# Patient Record
Sex: Female | Born: 1950 | Race: White | Hispanic: No | Smoking: Current every day smoker
Health system: Southern US, Community
[De-identification: ages and names within clinical notes are randomized; demographics above are authoritative.]

## PROBLEM LIST (undated history)

## (undated) DIAGNOSIS — I739 Peripheral vascular disease, unspecified: Secondary | ICD-10-CM

## (undated) DIAGNOSIS — J449 Chronic obstructive pulmonary disease, unspecified: Secondary | ICD-10-CM

## (undated) DIAGNOSIS — M797 Fibromyalgia: Secondary | ICD-10-CM

## (undated) DIAGNOSIS — G709 Myoneural disorder, unspecified: Secondary | ICD-10-CM

## (undated) DIAGNOSIS — E785 Hyperlipidemia, unspecified: Secondary | ICD-10-CM

## (undated) DIAGNOSIS — I10 Essential (primary) hypertension: Secondary | ICD-10-CM

## (undated) DIAGNOSIS — I6350 Cerebral infarction due to unspecified occlusion or stenosis of unspecified cerebral artery: Secondary | ICD-10-CM

## (undated) DIAGNOSIS — E039 Hypothyroidism, unspecified: Secondary | ICD-10-CM

## (undated) DIAGNOSIS — R1312 Dysphagia, oropharyngeal phase: Secondary | ICD-10-CM

## (undated) DIAGNOSIS — IMO0001 Reserved for inherently not codable concepts without codable children: Secondary | ICD-10-CM

## (undated) DIAGNOSIS — G458 Other transient cerebral ischemic attacks and related syndromes: Secondary | ICD-10-CM

## (undated) DIAGNOSIS — R413 Other amnesia: Secondary | ICD-10-CM

## (undated) DIAGNOSIS — G8190 Hemiplegia, unspecified affecting unspecified side: Secondary | ICD-10-CM

## (undated) DIAGNOSIS — I2510 Atherosclerotic heart disease of native coronary artery without angina pectoris: Secondary | ICD-10-CM

## (undated) DIAGNOSIS — Z9981 Dependence on supplemental oxygen: Secondary | ICD-10-CM

## (undated) HISTORY — DX: Dependence on supplemental oxygen: Z99.81

## (undated) HISTORY — PX: THYMECTOMY: SHX1063

## (undated) HISTORY — DX: Chronic obstructive pulmonary disease, unspecified: J44.9

## (undated) HISTORY — DX: Hemiplegia, unspecified affecting unspecified side: G81.90

## (undated) HISTORY — DX: Peripheral vascular disease, unspecified: I73.9

## (undated) HISTORY — DX: Dysphagia, oropharyngeal phase: R13.12

## (undated) HISTORY — DX: Other amnesia: R41.3

## (undated) HISTORY — PX: ESOPHAGOGASTRODUODENOSCOPY: SHX1529

## (undated) HISTORY — PX: CORONARY STENT PLACEMENT: SHX1402

## (undated) HISTORY — PX: THYROID SURGERY: SHX805

## (undated) HISTORY — DX: Essential (primary) hypertension: I10

## (undated) HISTORY — PX: MOUTH SURGERY: SHX715

## (undated) HISTORY — DX: Myoneural disorder, unspecified: G70.9

## (undated) HISTORY — DX: Hyperlipidemia, unspecified: E78.5

## (undated) HISTORY — DX: Cerebral infarction due to unspecified occlusion or stenosis of unspecified cerebral artery: I63.50

## (undated) HISTORY — DX: Other transient cerebral ischemic attacks and related syndromes: G45.8

## (undated) HISTORY — DX: Hypothyroidism, unspecified: E03.9

## (undated) HISTORY — DX: Fibromyalgia: M79.7

## (undated) HISTORY — PX: COLONOSCOPY: SHX174

## (undated) HISTORY — PX: CATARACT EXTRACTION: SUR2

## (undated) HISTORY — DX: Atherosclerotic heart disease of native coronary artery without angina pectoris: I25.10

## (undated) HISTORY — DX: Reserved for inherently not codable concepts without codable children: IMO0001

---

## 2014-10-03 ENCOUNTER — Encounter: Payer: Self-pay | Admitting: Pulmonary Disease

## 2014-10-06 ENCOUNTER — Ambulatory Visit (INDEPENDENT_AMBULATORY_CARE_PROVIDER_SITE_OTHER): Payer: Self-pay | Admitting: Pulmonary Disease

## 2014-10-06 ENCOUNTER — Other Ambulatory Visit: Payer: Self-pay

## 2014-10-06 ENCOUNTER — Encounter: Payer: Self-pay | Admitting: Pulmonary Disease

## 2014-10-06 ENCOUNTER — Ambulatory Visit (INDEPENDENT_AMBULATORY_CARE_PROVIDER_SITE_OTHER)
Admission: RE | Admit: 2014-10-06 | Discharge: 2014-10-06 | Disposition: A | Discharge status: Home / Self Care | Payer: Self-pay | Source: Ambulatory Visit | Attending: Pulmonary Disease | Admitting: Pulmonary Disease

## 2014-10-06 VITALS — BP 140/64 | HR 76 | Ht 64.0 in | Wt 145.8 lb

## 2014-10-06 DIAGNOSIS — R059 Cough, unspecified: Secondary | ICD-10-CM

## 2014-10-06 DIAGNOSIS — J449 Chronic obstructive pulmonary disease, unspecified: Secondary | ICD-10-CM | POA: Insufficient documentation

## 2014-10-06 DIAGNOSIS — I10 Essential (primary) hypertension: Secondary | ICD-10-CM | POA: Insufficient documentation

## 2014-10-06 DIAGNOSIS — Z720 Tobacco use: Secondary | ICD-10-CM

## 2014-10-06 DIAGNOSIS — I2510 Atherosclerotic heart disease of native coronary artery without angina pectoris: Secondary | ICD-10-CM | POA: Insufficient documentation

## 2014-10-06 DIAGNOSIS — I739 Peripheral vascular disease, unspecified: Secondary | ICD-10-CM | POA: Insufficient documentation

## 2014-10-06 DIAGNOSIS — I639 Cerebral infarction, unspecified: Secondary | ICD-10-CM | POA: Insufficient documentation

## 2014-10-06 DIAGNOSIS — D510 Vitamin B12 deficiency anemia due to intrinsic factor deficiency: Secondary | ICD-10-CM | POA: Insufficient documentation

## 2014-10-06 DIAGNOSIS — R05 Cough: Secondary | ICD-10-CM

## 2014-10-06 DIAGNOSIS — J9691 Respiratory failure, unspecified with hypoxia: Secondary | ICD-10-CM | POA: Insufficient documentation

## 2014-10-06 DIAGNOSIS — G7000 Myasthenia gravis without (acute) exacerbation: Secondary | ICD-10-CM | POA: Insufficient documentation

## 2014-10-06 DIAGNOSIS — G8194 Hemiplegia, unspecified affecting left nondominant side: Secondary | ICD-10-CM | POA: Insufficient documentation

## 2014-10-06 DIAGNOSIS — M797 Fibromyalgia: Secondary | ICD-10-CM | POA: Insufficient documentation

## 2014-10-06 DIAGNOSIS — R55 Syncope and collapse: Secondary | ICD-10-CM | POA: Insufficient documentation

## 2014-10-06 DIAGNOSIS — F17200 Nicotine dependence, unspecified, uncomplicated: Secondary | ICD-10-CM

## 2014-10-06 DIAGNOSIS — E785 Hyperlipidemia, unspecified: Secondary | ICD-10-CM | POA: Insufficient documentation

## 2014-10-06 DIAGNOSIS — J9611 Chronic respiratory failure with hypoxia: Secondary | ICD-10-CM

## 2014-10-06 DIAGNOSIS — E063 Autoimmune thyroiditis: Secondary | ICD-10-CM | POA: Insufficient documentation

## 2014-10-06 DIAGNOSIS — G458 Other transient cerebral ischemic attacks and related syndromes: Secondary | ICD-10-CM | POA: Insufficient documentation

## 2014-10-06 NOTE — Patient Instructions (Signed)
1. We're going to check breathing tests as well as her oxygen while walking before your next appointment. 2. I'm checking a chest x-ray today to determine what could be causing her cough other than COPD. 3. Continue using her albuterol inhaler and nebulizer as prescribed. Your daughter is checking into drug company prescription assistance programs to help you afford inhalers. 4. We are checking a culture of her sputum to determine if you have any organisms that could be causing your cough. 5. Please continue working on gradually weaning herself off of cigarettes. 6. You'll return to clinic in 4-6 weeks but please contact my office if you have any further questions.

## 2014-10-06 NOTE — Progress Notes (Signed)
Subjective:    Patient ID: Erika Gross, female    DOB: Mar 13, 1950, 64 y.o.   MRN: [id number]  HPI She has a long history of tobacco use and was remotely diagnosed with COPD. She reports initially she had breathing tests about 30 years ago but none in the last decade. She has been on oxygen since 2010-2011. She reports she was previously on Advair and performing proper oral hygiene but developed "sores" in her mouth that caused her significant pain. Previously was tried on [facility] and tolerated it well but was unable to afford it. She reports her breathing was better on [facility] and it did seem to help her expectorate mucus. She routinely gets sick, especially in the winter months. She reports she gets sick 3-4 times a season requiring antibiotics and/or steroids. She reports a chronic cough productive of a tan to greenish tinged mucus that amounts to 100-200cc daily. She denies any hemoptysis. She does frequently have wheezing. She does have intermittent chest discomfort that feels like a "gnawing" pain. She has had stents placed before and follows with cardiology. She does have frequent falls. She reports she runs an intermittent low-grade fever on a daily basis. She reports hot & cold chills with sweats intermittently. No dysuria or hematuria. She uses her nebulizer/rescue inhaler twice daily and feels it does help with her breathing.  Review of Systems No nausea or emesis. She reports diffuse lower abdominal pain that has been ongoing for a couple weeks. No rashes but does bruise easily. A pertinent 14 point review of systems is negative except as per the history of presenting illness.  Allergies  Allergen Reactions  . Advair Diskus [Fluticasone-Salmeterol]   . Ibuprofen     nausea  . Nsaids     D/t bleeding in stomach   Current Outpatient Prescriptions on File Prior to Visit  Medication Sig Dispense Refill  . albuterol (PROVENTIL HFA;VENTOLIN HFA) 108 (90 BASE) MCG/ACT inhaler Inhale  1-2 puffs into the lungs 2 (two) times daily.    Marland Kitchen amitriptyline (ELAVIL) 50 MG tablet Take 50 mg by mouth at bedtime.    Marland Kitchen aspirin 81 MG tablet Take 81 mg by mouth daily.    . baclofen (LIORESAL) 10 MG tablet Take 10 mg by mouth 3 (three) times daily.    . clopidogrel (PLAVIX) 75 MG tablet Take 75 mg by mouth daily.    Marland Kitchen gabapentin (NEURONTIN) 300 MG capsule Take 300 mg by mouth 4 (four) times daily.    Marland Kitchen HYDROcodone-acetaminophen (NORCO) 7.5-325 MG per tablet Take 1 tablet by mouth every 6 (six) hours as needed for moderate pain.    . isosorbide dinitrate (ISORDIL) 30 MG tablet Take 30 mg by mouth daily.    Marland Kitchen levothyroxine (SYNTHROID, LEVOTHROID) 112 MCG tablet Take 112 mcg by mouth daily before breakfast.    . levothyroxine (SYNTHROID, LEVOTHROID) 300 MCG tablet Take 300 mcg by mouth daily before breakfast.    . LORazepam (ATIVAN) 1 MG tablet Take 1 mg by mouth every 8 (eight) hours.    . methocarbamol (ROBAXIN) 750 MG tablet 1-2 tabs 3 times a day as needed    . metoprolol tartrate (LOPRESSOR) 25 MG tablet Take 25 mg by mouth daily.    . mometasone-formoterol (DULERA) 100-5 MCG/ACT AERO Inhale 2 puffs into the lungs 2 (two) times daily.    . pantoprazole (PROTONIX) 40 MG tablet Take 40 mg by mouth 2 (two) times daily.    . Polyethylene Glycol 1000 POWD by Does not  apply route at bedtime.    . pravastatin (PRAVACHOL) 40 MG tablet Take 80 mg by mouth daily.    . traZODone (DESYREL) 100 MG tablet Take 100 mg by mouth at bedtime.     No current facility-administered medications on file prior to visit.   Past Medical History  Diagnosis Date  . Memory loss   . Myalgia and myositis   . Myasthenic syndrome   . Oxygen dependent   . Subclavian steal syndrome   . Hemiplegia affecting nondominant side   . Dysphagia, oropharyngeal phase   . Cerebral artery occlusion with cerebral infarction   . Fibromyalgia   . Coronary artery disease     s/p stent  . Peripheral vascular disease   . COPD  (chronic obstructive pulmonary disease)   . Hypertension   . Hypothyroidism     Hashimoto's thyroiditis  . Hyperlipidemia    Past Surgical History  Procedure Laterality Date  . Coronary stent placement    . Thyroid surgery    . Cesarean section    . Cataract extraction      left eye  . Thymectomy    . Mouth surgery      skin graft  . Colonoscopy    . Esophagogastroduodenoscopy     Family History  Problem Relation Age of Onset  . Ovarian cancer Mother   . Heart attack Mother   . Lung cancer Mother   . Tuberculosis Father   . Anuerysm Father   . Lung cancer Father   . Thyroid disease Paternal Aunt   . Crohn's disease Daughter   . Uveitis Daughter   . Fibromyalgia Daughter   . Asthma Daughter   . Renal Disease Son   . Celiac disease Grandchild   . Asthma Grandchild    History   Social History  . Marital Status: Widowed    Spouse Name: N/A  . Number of Children: 2  . Years of Education: N/A   Occupational History  . disabled    Social History Main Topics  . Smoking status: Current Every Day Smoker -- 1.00 packs/day for 48 years    Types: Cigarettes    Start date: 02/28/1966  . Smokeless tobacco: Not on file     Comment: peak rate of 1.5ppd  . Alcohol Use: No  . Drug Use: No  . Sexual Activity: Not on file   Other Topics Concern  . None   Social History Narrative   Originally from [place]. Always lived in [place]. Previously traveled to [place], [place], [place], & [place]. No international travel. 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 Never had treatment. She has a cat & dogs currently. No bird, mold, or hot tub exposure. 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       Objective:   Physical Exam  Blood pressure 140/64, pulse 76, height  (1.626 m), weight 145 lb 12.8 oz (66.134 kg), SpO2 97 %. General:  Awake. Alert. No acute distress. Daughter accompanying patient today.    Integument:  Warm & dry. No rash on exposed skin. Bruise of various ages on bilateral upper extremities. Lymphatics:  No appreciated cervical or supraclavicular lymphadenoapthy. HEENT:  Moist mucus membranes. No oral ulcers. No scleral injection or icterus. PERRL. Cardiovascular:  Regular rate. No edema. No appreciable JVD.  Pulmonary:  Bilateral basilar crackles. Good aeration bilaterally. Symmetric chest wall expansion. No  accessory muscle use on nasal cannula oxygen. Abdomen: Soft. Normal bowel sounds. Nondistended. Grossly nontender. Musculoskeletal:  Normal bulk and tone. Hand grip strength 4+/5 bilaterally. No joint deformity or effusion appreciated. Neurological:  CN 2-12 grossly in tact. No meningismus. Moving all 4 extremities equally. Symmetric patellar deep tendon reflexes. Psychiatric:  Mood and affect congruent. Speech normal rhythm, rate & tone.     Assessment & Plan:  The patient is a 64 year old female with remote history of diagnosis of COPD with chronic bronchitis as well as chronic hypoxic respiratory failure. Patient continues to use tobacco and I spent over 3 minutes counseling the patient on the need to completely quit and abstain from its use to prevent worsening of lung function as well as other potential consequences including stroke, heart attack, etc. We also discussed at length her ability to 4 medications and I recommended her daughter who works in a pharmacy investigating into prescription drug assistance/charity programs for [employer] or [employer]. Her cardiologist has recommended against Wellbutrin, Chantix, and nicotine replacement given her medical problems and history. I instructed the patient to contact my office if she had any further questions or concerns before her next appointment.  1. COPD: Unclear severity. Checking full PFT with bronchodilator challenge before next appointment. Also checking alpha-1 antitrypsin level & genotype. Patient to continue using when  necessary albuterol. Daughter investigating prescription drug assistance programs for [facility] & [employer]. 2. Cough: Checking sputum culture for AFB, fungus, & bacteria. 3. Chronic hypoxic respiratory failure: Checking 6 minute walk test. 4. Ongoing tobacco use: Spelled over 3 minutes counseling the patient will need to completely quit smoking. Not candidate for nicotine replacement, Chantix, or Wellbutrin. Recommend trying a gradual taper of cigarette use. 5. History of TB exposure/+PPD: Patient never treated. Checking chest x-ray PA/LAT today. 6. Follow-up: Patient to return to clinic in 4-6 weeks or sooner if needed.

## 2014-10-07 ENCOUNTER — Telehealth: Payer: Self-pay | Admitting: Pulmonary Disease

## 2014-10-07 ENCOUNTER — Other Ambulatory Visit: Payer: Self-pay | Admitting: Pulmonary Disease

## 2014-10-07 DIAGNOSIS — J849 Interstitial pulmonary disease, unspecified: Secondary | ICD-10-CM | POA: Insufficient documentation

## 2014-10-07 NOTE — Telephone Encounter (Signed)
Called and spoke with patient's daughter regarding CXR results highly suspicious/suggestive of ILD. Checking HRCT Chest W/O. Further workup depending on this result.

## 2014-10-08 LAB — ALPHA-1-ANTITRYPSIN: A1 ANTITRYPSIN SER: 125 mg/dL (ref 83–199)

## 2014-10-14 ENCOUNTER — Inpatient Hospital Stay: Admission: RE | Admit: 2014-10-14 | Payer: Self-pay | Source: Ambulatory Visit

## 2014-10-15 ENCOUNTER — Other Ambulatory Visit: Payer: Self-pay | Admitting: Pulmonary Disease

## 2014-10-15 ENCOUNTER — Telehealth: Payer: Self-pay | Admitting: Pulmonary Disease

## 2014-10-15 ENCOUNTER — Other Ambulatory Visit: Payer: Self-pay

## 2014-10-15 ENCOUNTER — Ambulatory Visit (INDEPENDENT_AMBULATORY_CARE_PROVIDER_SITE_OTHER)
Admission: RE | Admit: 2014-10-15 | Discharge: 2014-10-15 | Disposition: A | Discharge status: Home / Self Care | Payer: Self-pay | Source: Ambulatory Visit | Attending: Pulmonary Disease | Admitting: Pulmonary Disease

## 2014-10-15 DIAGNOSIS — J849 Interstitial pulmonary disease, unspecified: Secondary | ICD-10-CM

## 2014-10-15 DIAGNOSIS — R059 Cough, unspecified: Secondary | ICD-10-CM

## 2014-10-15 DIAGNOSIS — R05 Cough: Secondary | ICD-10-CM

## 2014-10-15 NOTE — Telephone Encounter (Signed)
Attempted to call regarding results of CT scan. No answer. Left messages I would contact again at a different time.

## 2014-10-15 NOTE — Telephone Encounter (Signed)
Spoke with the patient regarding the results of her high-resolution CT scan. Her daughter was also present for the conversation via phone. It seems as though her underlying emphysema is more pronounced than what appears to be a mild amount of subpleural intralobular septal thickening most pronounced in the bases. Questionable minimal bronchiectasis. This does favor in MS IP pattern but could also be early UIP. Plan to screen the patient for underlying hypersensitivity pneumonitis versus an autoimmune disease that could be contributing. Also relayed the results of her serum alpha-1 level as normal but still awaiting genotype.

## 2014-10-18 LAB — RESPIRATORY CULTURE OR RESPIRATORY AND SPUTUM CULTURE
Culture: NORMAL
Organism ID, Bacteria: NORMAL

## 2014-10-22 ENCOUNTER — Telehealth: Payer: Self-pay | Admitting: Pulmonary Disease

## 2014-10-22 NOTE — Telephone Encounter (Signed)
Still no answer. Left generic voicemail to contact clinic to set up appointment for labs.

## 2014-10-22 NOTE — Telephone Encounter (Signed)
Called spoke with daughter Marchelle Folks. She is aware lab orders were already placed. Nothing further needed

## 2014-10-27 ENCOUNTER — Other Ambulatory Visit (INDEPENDENT_AMBULATORY_CARE_PROVIDER_SITE_OTHER): Payer: Self-pay

## 2014-10-27 ENCOUNTER — Other Ambulatory Visit: Payer: Self-pay

## 2014-10-27 DIAGNOSIS — J849 Interstitial pulmonary disease, unspecified: Secondary | ICD-10-CM

## 2014-10-27 DIAGNOSIS — J9611 Chronic respiratory failure with hypoxia: Secondary | ICD-10-CM

## 2014-10-27 LAB — SEDIMENTATION RATE: SED RATE: 19 mm/h (ref 0–22)

## 2014-10-28 LAB — CYCLIC CITRUL PEPTIDE ANTIBODY, IGG: Cyclic Citrullin Peptide Ab: <2 U/mL (ref 0.0–5.0)

## 2014-10-30 LAB — RESPIRATORY CULTURE OR RESPIRATORY AND SPUTUM CULTURE: Organism ID, Bacteria: NORMAL

## 2014-11-02 LAB — HYPERSENSITIVITY PNUEMONITIS PROFILE

## 2014-11-10 LAB — FUNGUS CULTURE W SMEAR: Smear Result: NONE SEEN

## 2014-11-13 ENCOUNTER — Ambulatory Visit: Payer: Self-pay

## 2014-11-13 ENCOUNTER — Ambulatory Visit (INDEPENDENT_AMBULATORY_CARE_PROVIDER_SITE_OTHER): Payer: Self-pay | Admitting: Pulmonary Disease

## 2014-11-13 ENCOUNTER — Other Ambulatory Visit: Payer: Self-pay

## 2014-11-13 VITALS — BP 134/68 | HR 84 | Ht 64.0 in | Wt 142.0 lb

## 2014-11-13 DIAGNOSIS — R059 Cough, unspecified: Secondary | ICD-10-CM

## 2014-11-13 DIAGNOSIS — J849 Interstitial pulmonary disease, unspecified: Secondary | ICD-10-CM

## 2014-11-13 DIAGNOSIS — Z201 Contact with and (suspected) exposure to tuberculosis: Secondary | ICD-10-CM

## 2014-11-13 DIAGNOSIS — R0902 Hypoxemia: Secondary | ICD-10-CM

## 2014-11-13 DIAGNOSIS — J439 Emphysema, unspecified: Secondary | ICD-10-CM | POA: Insufficient documentation

## 2014-11-13 DIAGNOSIS — J449 Chronic obstructive pulmonary disease, unspecified: Secondary | ICD-10-CM

## 2014-11-13 DIAGNOSIS — R05 Cough: Secondary | ICD-10-CM

## 2014-11-13 LAB — PULMONARY FUNCTION TEST
DL/VA % PRED: 53 %
DL/VA: 2.56 ml/min/mmHg/L
DLCO UNC: 9.03 ml/min/mmHg
DLCO unc % pred: 37 %
FEF 25-75 POST: 1.33 L/s
FEF 25-75 Pre: 1.28 L/sec
FEF2575-%Change-Post: 3 %
FEF2575-%PRED-POST: 59 %
FEF2575-%PRED-PRE: 56 %
FEV1-%CHANGE-POST: -1 %
FEV1-%PRED-PRE: 64 %
FEV1-%Pred-Post: 63 %
FEV1-POST: 1.59 L
FEV1-PRE: 1.61 L
FEV1FVC-%Change-Post: 0 %
FEV1FVC-%PRED-PRE: 99 %
FEV6-%Change-Post: -1 %
FEV6-%PRED-POST: 65 %
FEV6-%Pred-Pre: 66 %
FEV6-Post: 2.03 L
FEV6-Pre: 2.06 L
FEV6FVC-%CHANGE-POST: 0 %
FEV6FVC-%Pred-Post: 103 %
FEV6FVC-%Pred-Pre: 103 %
FVC-%CHANGE-POST: -1 %
FVC-%PRED-POST: 63 %
FVC-%Pred-Pre: 64 %
FVC-PRE: 2.08 L
FVC-Post: 2.04 L
POST FEV1/FVC RATIO: 78 %
PRE FEV1/FVC RATIO: 77 %
PRE FEV6/FVC RATIO: 99 %
Post FEV6/FVC ratio: 99 %

## 2014-11-13 MED ORDER — FLUTTER DEVI: Status: AC

## 2014-11-13 NOTE — Progress Notes (Signed)
PFT done today. 

## 2014-11-13 NOTE — Patient Instructions (Signed)
1. Continue using her nebulizer and inhalers as prescribed. 2. We are giving you a prescription for a flutter valve/Acapella. Blow into this device 3 times every time you use it. And use it 3 times daily to help break up mucus and cough. 3. I'm doing blood work today to check every 4 different diseases that could be causing the inflammation/scarring in your lungs that we showed on her CT scan. 4. Continue to try to wean your cigarette use daily. 5. I will see her back in 2-3 months but please call me if you have any new breathing problems.

## 2014-11-13 NOTE — Progress Notes (Signed)
Subjective:    Patient ID: Erika Gross, female    DOB: 1950/06/01, 64 y.o.   MRN: [id number]  C.C.:  Follow-up for COPD w/ Emphysema, Cough, Hypoxia, Ongoing Tobacco Use, & History of TB Exposure/+PPD.  HPI  COPD w/ Emphysema:  Patient has no evidence of airway obstruction on spirometry today. Patient's daughter was checking for potential drug assistance programs for [facility] & [employer] at last appointment. Patient did have air trapping on high-resolution CT imaging suggesting airways dysfunction. She is using her nebulizer twice daily. She reports chronic wheezing.   Cough: Likely secondary to underlying parenchymal lung disease seen on CT imaging favoring NSIP. Autoimmune workup negative so far but repeat testing has not been performed as ordered and requested of the patient. She reports her dyspnea does seem to fluctuate. She continues to cough and reports she did have a "low-grade" temperature last week for 2 days. She is still producing a dark, greenish mucus.   Hypoxia:  Patient unable to perform walk test today due to instability of gait. She continues to use oxygen and is on 3 L/m continuously. She increases this to 4 L/m on her own at times with increased dyspnea.  Ongoing Tobacco use: Recommend trying a gradual taper of cigarette use at last appointment. Patient reports she has only been able to get down to 10 cigarettes daily at best since last appointment. She reports she is "really trying".  H/O TB Exposure/+ PPD: Patient never treated.  Review of Systems She reports constant chest pain in her anterior chest. She does have pleurisy at times as well. No new rashes or bruising.  Allergies  Allergen Reactions  . Advair Diskus [Fluticasone-Salmeterol]   . Ibuprofen     nausea  . Nsaids     D/t bleeding in stomach   Current Outpatient Prescriptions on File Prior to Visit  Medication Sig Dispense Refill  . albuterol (PROVENTIL HFA;VENTOLIN HFA) 108 (90 BASE) MCG/ACT  inhaler Inhale 1-2 puffs into the lungs 2 (two) times daily.    Marland Kitchen amitriptyline (ELAVIL) 50 MG tablet Take 50 mg by mouth at bedtime.    Marland Kitchen aspirin 81 MG tablet Take 81 mg by mouth daily.    . baclofen (LIORESAL) 10 MG tablet Take 10 mg by mouth 3 (three) times daily.    . clopidogrel (PLAVIX) 75 MG tablet Take 75 mg by mouth daily.    Marland Kitchen gabapentin (NEURONTIN) 300 MG capsule Take 300 mg by mouth 4 (four) times daily.    Marland Kitchen ibuprofen (ADVIL,MOTRIN) 200 MG tablet Take 200 mg by mouth every 6 (six) hours as needed.    Marland Kitchen ipratropium-albuterol (DUONEB) 0.5-2.5 (3) MG/3ML SOLN Take 3 mLs by nebulization 2 (two) times daily.     . isosorbide dinitrate (ISORDIL) 30 MG tablet Take 30 mg by mouth daily.    Marland Kitchen levothyroxine (SYNTHROID, LEVOTHROID) 150 MCG tablet Take 150 mcg by mouth daily before breakfast.    . LORazepam (ATIVAN) 1 MG tablet Take 1 mg by mouth every 8 (eight) hours.    . methocarbamol (ROBAXIN) 750 MG tablet 1-2 tabs 3 times a day as needed    . metoprolol tartrate (LOPRESSOR) 25 MG tablet Take 25 mg by mouth daily.    . pantoprazole (PROTONIX) 40 MG tablet Take 40 mg by mouth 2 (two) times daily.    . Polyethylene Glycol 1000 POWD by Does not apply route at bedtime.    . pravastatin (PRAVACHOL) 40 MG tablet Take 80 mg by mouth daily.    Marland Kitchen  traZODone (DESYREL) 100 MG tablet Take 100 mg by mouth at bedtime.     No current facility-administered medications on file prior to visit.   Past Medical History  Diagnosis Date  . Memory loss   . Myalgia and myositis   . Myasthenic syndrome   . Oxygen dependent   . Subclavian steal syndrome   . Hemiplegia affecting nondominant side   . Dysphagia, oropharyngeal phase   . Cerebral artery occlusion with cerebral infarction   . Fibromyalgia   . Coronary artery disease     s/p stent  . Peripheral vascular disease   . COPD (chronic obstructive pulmonary disease)   . Hypertension   . Hypothyroidism     Hashimoto's thyroiditis  . Hyperlipidemia     Past Surgical History  Procedure Laterality Date  . Coronary stent placement    . Thyroid surgery    . Cesarean section    . Cataract extraction      left eye  . Thymectomy    . Mouth surgery      skin graft  . Colonoscopy    . Esophagogastroduodenoscopy     Family History  Problem Relation Age of Onset  . Ovarian cancer Mother   . Heart attack Mother   . Lung cancer Mother   . Tuberculosis Father   . Anuerysm Father   . Lung cancer Father   . Thyroid disease Paternal Aunt   . Crohn's disease Daughter   . Uveitis Daughter   . Fibromyalgia Daughter   . Asthma Daughter   . Renal Disease Son   . Celiac disease Grandchild   . Asthma Grandchild    Social History   Social History  . Marital Status: Widowed    Spouse Name: N/A  . Number of Children: 2  . Years of Education: N/A   Occupational History  . disabled    Social History Main Topics  . Smoking status: Current Every Day Smoker -- 1.00 packs/day for 48 years    Types: Cigarettes    Start date: 02/28/1966  . Smokeless tobacco: Not on file     Comment: peak rate of 1.5ppd  . Alcohol Use: No  . Drug Use: No  . Sexual Activity: Not on file   Other Topics Concern  . Not on file   Social History Narrative   Originally from [place]. Always lived in [place]. Previously traveled to [place], [place], [place], & [place]. No international travel. 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 Never had treatment. She has a cat & dogs currently. No bird, mold, or hot tub exposure. 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       Objective:   Physical Exam  Blood pressure 134/68, pulse 84, height 5' 4" (1.626 m), weight 142 lb (64.411 kg), SpO2 97 %. General:  Awake. Alert. No acute distress. Daughter again accompanying patient today.  Integument:  Warm & dry. No rash on exposed skin.  Lymphatics:  No appreciated cervical or supraclavicular  lymphadenoapthy. HEENT:  Moist mucus membranes. No oral ulcers. No scleral injection. Cardiovascular:  Regular rate. No edema. No appreciable JVD.  Pulmonary:  Bilateral basilar crackles unchanged. Good aeration bilaterally. Musculoskeletal: Hand grip strength 4+/5 bilaterally. No joint deformity or effusion appreciated.   PFT 11/13/14: FVC 2.08 L (64%) FEV1 1.61 L (64%) FEV1/FVC 0.77 FEF 25-75 1.28 L (86%) no bronchodilator response TLC 3.52  L (69%) RV 73% ERV 65% DLCO uncorrected 37%  IMAGING HRCT CHEST W/O 10/15/14 (personally reviewed by me with the patient and her daughter today): No mediastinal mass. Subcarinal lymph node measures 1.1 cm in short axis. No pleural or pericardial effusion. No pleural thickening appreciated. Patchy groundglass bilaterally as well as some thickening of the peribronchovascular interstitium. Mild cylindrical bronchiectasis most predominantly in the lower lung zones. Diffuse bronchial wall thickening. Centrilobular & moderate paraseptal emphysema with areas of air trapping. 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  MICROBIOLOGY SPUTUM CTX (10/27/14): Normal oral flora SPUTUM CTX (10/15/14): Normal oral flora/Candida albicans/AFB ngtd  LABS 10/27/14 ESR: 19 Anti-CCP: <2.0 Hypersensitivity pneumonitis panel: Negative  10/06/14 Alpha-1 antitrypsin: 125    Assessment & Plan:  64 year old very complex patient. She does have a NSIP pattern on her high-resolution CT imaging. So far her workup has been negative. I'm still waiting on a couple of additional tests. I personally reviewed her high-resolution CT scan with her today with her daughter present. We discussed at length her need for complete tobacco cessation as I do feel this is contributing to not only her underlying emphysema but also her chronic cough. I reviewed the results of her sputum culture tests which have not shown any evidence of an infectious etiology. Her spirometry today shows no evidence of airway obstruction but  certainly the air trapping seen on her high-resolution CT imaging correlates with the small airways dysfunction found today. The mild restriction on her lung volumes and substantial reduction in her monoxide diffusion capacity correlate with her underlying ILD. I do not feel that initiating immunosuppression is necessary at this time given her ongoing tobacco use and multiple other medical problems. I am somewhat concerned about possible reactivation tuberculosis given her prior history of positive PPD. We attempted to perform a walk test today to evaluate her degree of hypoxia but were unsuccessful as the patient had difficulty walking by herself. I instructed the patient and her daughter to contact me if they have any further questions or concerns or new breathing problems before her next appointment.  1. ILD/NSIP/mild restrictive lung disease: Holding on initiating immunosuppression at this time. Checking serum ANA with reflex to compress to panel, rheumatoid factor, and CRP. Repeat spirometry with DLCO at next appointment. 2. COPD with emphysema: Continuing patient on Duonebs. Daughter is continuing to investigate prescription drug assistance with [facility]. Instructed to use flutter valve 3 times a day to help with airway clearance. Awaiting finalization of sputum cultures. 3. Hypoxia: Patient to continue using oxygen as previously prescribed. Consider repeat 6 minute walk test with use of a walker or her wheelchair as a walking aid. 4. TB exposure/prior positive PPD: Checking serum QuantiFERON TB. This will need to be confirmed as negative prior to any consideration of immunosuppression. 5. Ongoing tobacco use: Spelled over 3 minutes counseling the patient will need for complete tobacco cessation. Recommended decreasing her use by 1 cigarette every week in an effort to completely quit. 6. Health maintenance: Recommended influenza vaccination in October. 7. Follow-up: Patient to return to clinic in 2-3  months or sooner if needed.

## 2014-11-14 LAB — ANA COMPREHENSIVE PANEL
Anti JO-1: <0.2 AI (ref 0.0–0.9)
Centromere Ab Screen: 3.8 AI — ABNORMAL HIGH (ref 0.0–0.9)
ENA SSA (RO) AB: 1.4 AI — AB (ref 0.0–0.9)
dsDNA Ab: 1 IU/mL (ref 0–9)

## 2014-11-14 LAB — ANA W/REFLEX: Anti Nuclear Antibody(ANA): POSITIVE — AB

## 2014-11-14 LAB — RHEUMATOID FACTOR: RHEUMATOID FACTOR: 15.4 [IU]/mL — AB (ref 0.0–13.9)

## 2014-11-14 LAB — C-REACTIVE PROTEIN: CRP: 8.4 mg/L — AB (ref 0.0–4.9)

## 2014-11-15 LAB — QUANTIFERON TB GOLD ASSAY (BLOOD)
INTERFERON GAMMA RELEASE ASSAY: NEGATIVE
Mitogen value: >10 IU/mL
QUANTIFERON NIL VALUE: 0.05 [IU]/mL
Quantiferon Tb Ag Minus Nil Value: 0 IU/mL
TB AG VALUE: 0.05 [IU]/mL

## 2014-11-27 LAB — AFB CULTURE WITH SMEAR (NOT AT ARMC): ACID FAST SMEAR: NONE SEEN

## 2014-12-02 ENCOUNTER — Telehealth: Payer: Self-pay | Admitting: Pulmonary Disease

## 2014-12-02 DIAGNOSIS — J849 Interstitial pulmonary disease, unspecified: Secondary | ICD-10-CM

## 2014-12-02 NOTE — Telephone Encounter (Signed)
Dr. Jamison Neighbor did you try to contact this patient? I do not seen any documentation in her chart. Thanks.

## 2014-12-12 ENCOUNTER — Telehealth: Payer: Self-pay | Admitting: Pulmonary Disease

## 2014-12-12 NOTE — Telephone Encounter (Signed)
Already called the patient. Erika Gross

## 2014-12-12 NOTE — Telephone Encounter (Signed)
Spoke with the patient regarding the results of her serum autoimmune workup. Findings appear mixed. It's unclear to me whether or not her myasthenia gravis could be affecting the serum test results. I am referring the patient to Dr. Rowe/Rheumatology for further evaluation and input. Patient is cutting back on her cigarette use by one pack per week. I encouraged her to continue. She will contact my office for any further questions or concerns.

## 2014-12-12 NOTE — Addendum Note (Signed)
Addended by: Tommie SamsSILVA, MINDY S on: 12/12/2014 03:24 PM   Modules accepted: Orders

## 2014-12-12 NOTE — Telephone Encounter (Signed)
Referral done. Do I need to make pt aware or does she already know?

## 2014-12-12 NOTE — Telephone Encounter (Signed)
Please refer the patient to Dr. Phylliss Bobowe (Rheumatology) for her ILD & abnormal autoimmune workup. Thanks.

## 2014-12-19 ENCOUNTER — Other Ambulatory Visit: Payer: Self-pay | Admitting: Pulmonary Disease

## 2014-12-19 DIAGNOSIS — J849 Interstitial pulmonary disease, unspecified: Secondary | ICD-10-CM

## 2014-12-22 ENCOUNTER — Telehealth: Payer: Self-pay | Admitting: Pulmonary Disease

## 2014-12-22 NOTE — Telephone Encounter (Signed)
Per referral note on pt: Note    Rheumatology appt with Dr. Maryjean KaKushwaha @ [facility] on 03/12/2015 @ 2:30pm Called and spoke with the patient she ask if it was covered under the [facility] Assistance Program I explained that it was not and she stated that she couldn't afford [facility] but she took down the appt information just in case.Lilian KapurAnita S Walker    --  Called spoke with pt. Made her aware of previous phone note that Dr. Jamison NeighborNestor did speak with her.  She wants Dr. Jamison NeighborNestor to know she may not be able to afford to go to [facility] and may cancel appt. Please advise thanks

## 2014-12-24 NOTE — Telephone Encounter (Signed)
Dr. Nestor please advise. Thanks. 

## 2014-12-25 NOTE — Telephone Encounter (Signed)
Well if she cannot afford it there's not much that can be helped. I'll see her at her scheduled f/u in December. JN

## 2014-12-26 NOTE — Telephone Encounter (Signed)
lmtcb x1 for pt. 

## 2014-12-29 NOTE — Telephone Encounter (Signed)
Pt is aware of JN's response. Nothing further was needed.

## 2015-02-12 ENCOUNTER — Ambulatory Visit: Payer: Self-pay | Admitting: Pulmonary Disease

## 2017-02-26 IMAGING — CT CT CHEST HIGH RESOLUTION W/O CM
3 of 8 series · 13 of 36 positions shown, 15 images · non-contrast
Comparison: No priors.

CLINICAL DATA: 64-year-old female with end-stage COPD on chronic
oxygen therapy. Productive cough. Evaluate for interstitial lung
disease.

EXAM:
CT CHEST WITHOUT CONTRAST
TECHNIQUE: Multidetector CT imaging of the chest was performed following the
standard protocol without intravenous contrast. High resolution
imaging of the lungs, as well as inspiratory and expiratory imaging,
was performed.

[Series 6: lung · axial · 0.63mm/px · z∈[-258,-48]mm · 7 of 56 slices shown, 9 images]
[im 7/56  mediastinal]
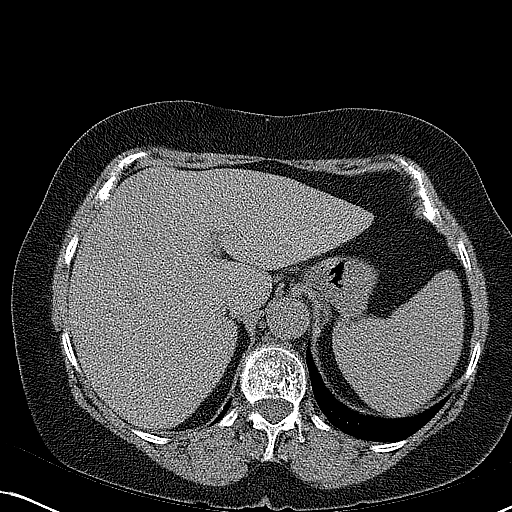
[im 7/56  lung]
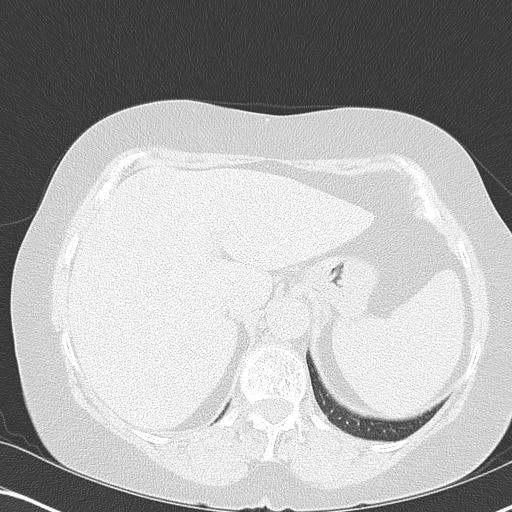
[im 14/56  lung]
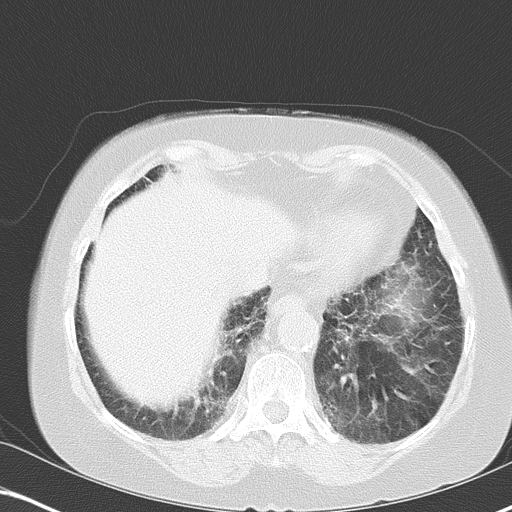
[im 21/56  lung]
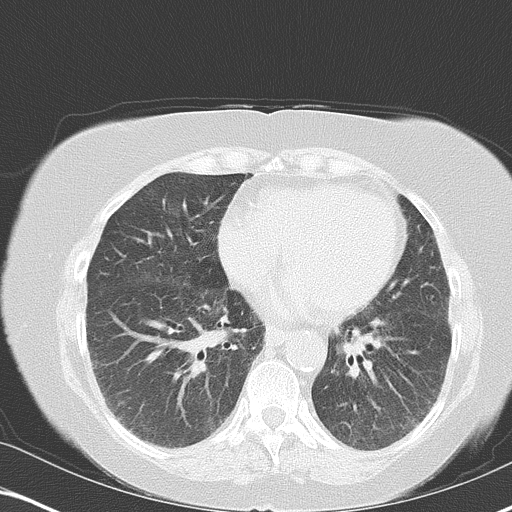
[im 28/56  lung]
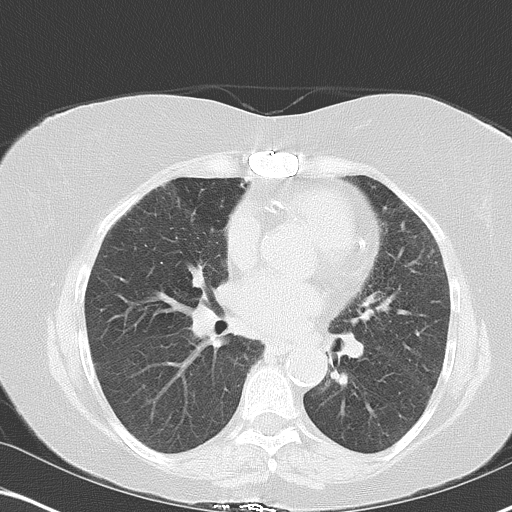
[im 35/56  mediastinal]
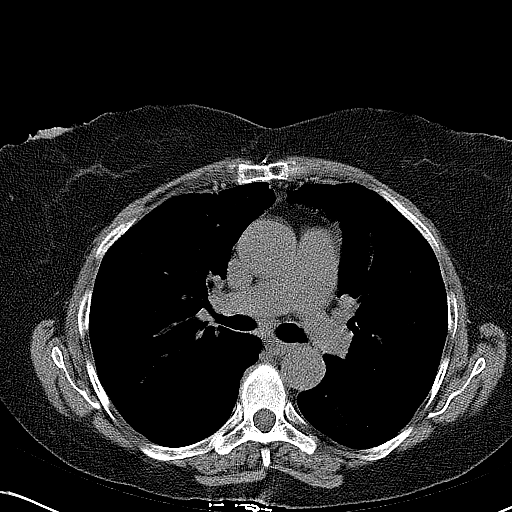
[im 35/56  lung]
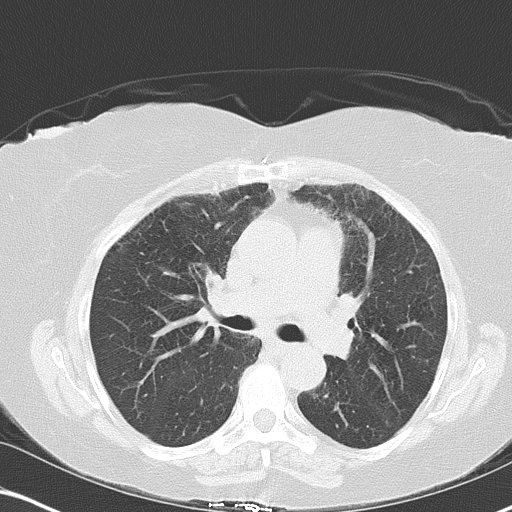
[im 42/56  lung]
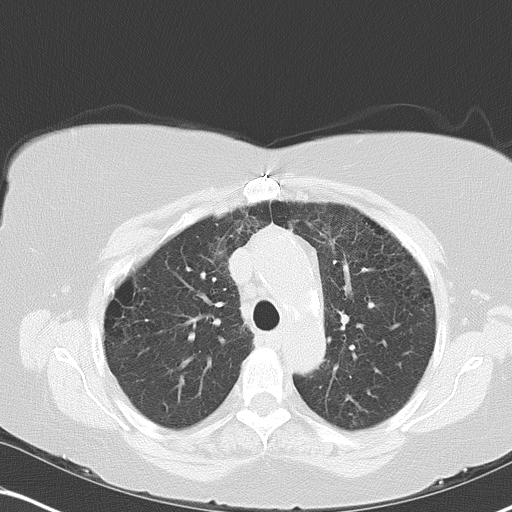
[im 49/56  lung]
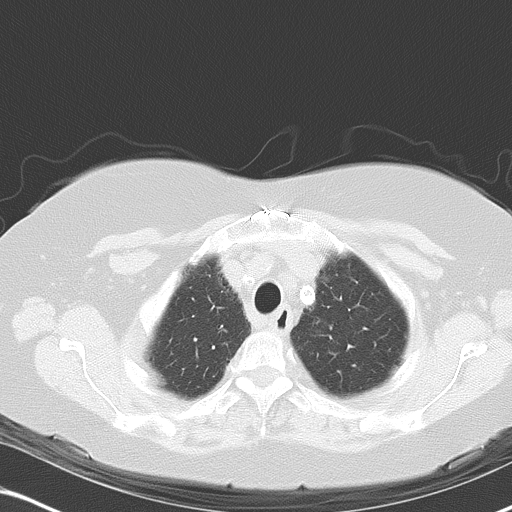

[Series 9: hires retro entire lungs · axial · 0.63mm/px · z∈[-219,-79]mm · 3 of 29 slices shown]
[im 8/29  lung]
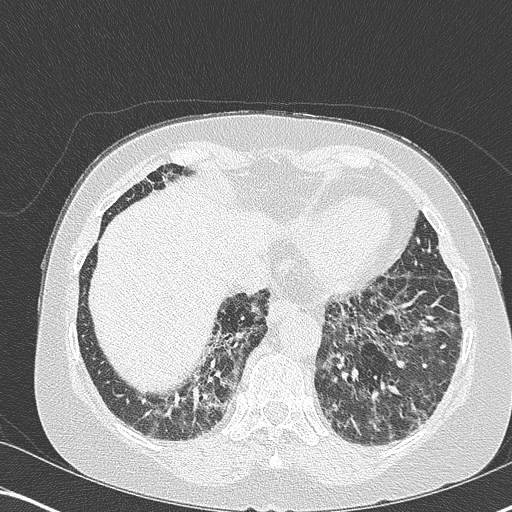
[im 15/29  lung]
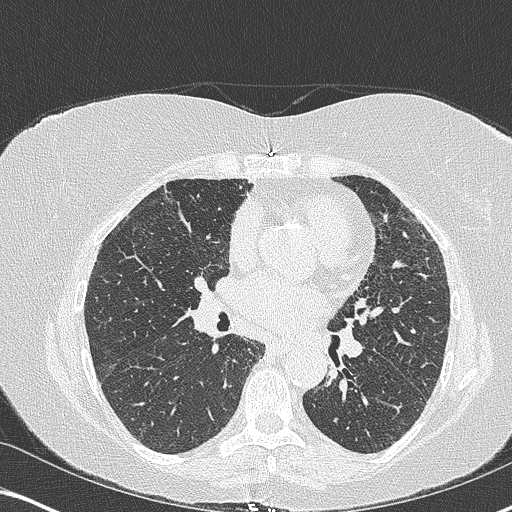
[im 22/29  lung]
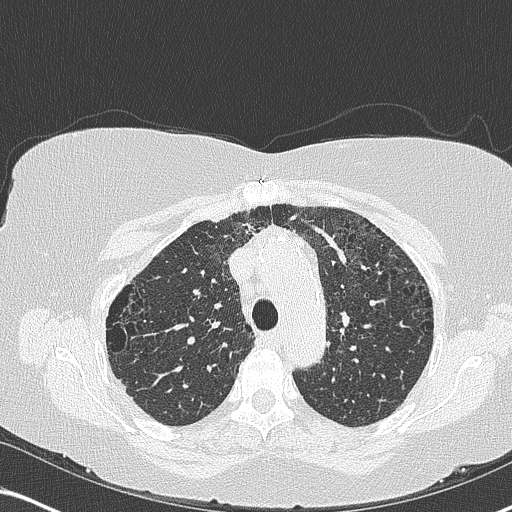

[Series 602: cor · coronal · 0.63mm/px · 3 of 109 slices shown]
[im 22/109  lung]
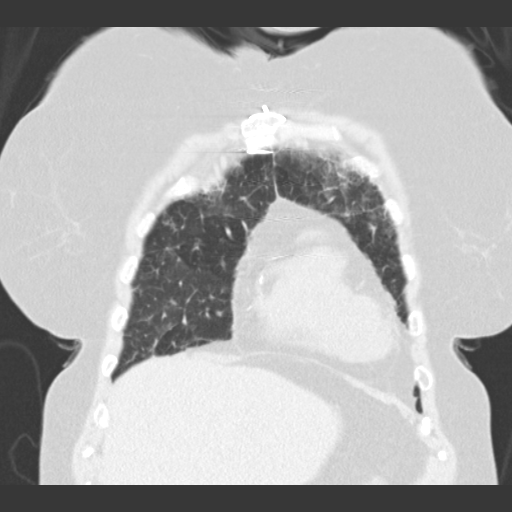
[im 44/109  lung]
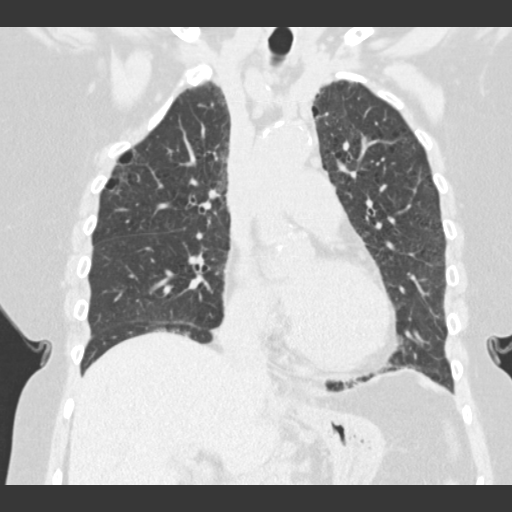
[im 65/109  lung]
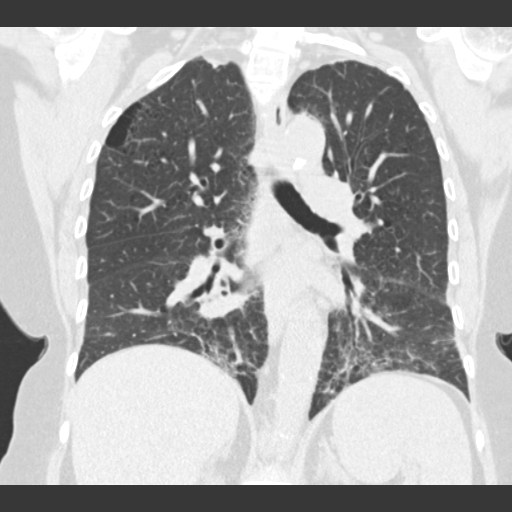

[13 of 36 positions shown; findings below may reference images not displayed]

FINDINGS: Mediastinum/Lymph Nodes: Heart size is normal. There is no
significant pericardial fluid, thickening or pericardial
calcification. There is atherosclerosis of the thoracic aorta, the
great vessels of the mediastinum and the coronary arteries,
including calcified atherosclerotic plaque in the left anterior
descending and right coronary arteries. Coronary artery stent in the
left anterior descending coronary artery. There is also a stent in
the proximal left subclavian artery. Numerous prominent borderline
enlarged and mildly enlarged mediastinal and hilar lymph nodes,
measuring up to 11 mm in short axis in the subcarinal nodal station.
Esophagus is unremarkable in appearance. No axillary
lymphadenopathy.

Lungs/Pleura: High-resolution images demonstrate patchy areas of
ground-glass attenuation in the lungs bilaterally. In the areas of
greatest involvement there is some thickening of the
peribronchovascular interstitium and some septal thickening with
regional areas of architectural distortion. A few areas of mild
cylindrical bronchiectasis and peripheral bronchiolectasis are
noted, most evident in the lower lobes of the lungs bilaterally.
These findings have a mild craniocaudal gradient, and are slightly
asymmetric (involving the left lung to a greater extent than the
right). Mild diffuse bronchial wall thickening with mild
centrilobular and moderate paraseptal emphysema. Inspiratory and
expiratory imaging demonstrates extensive air trapping, indicative
of small airways disease.

Upper Abdomen: Atherosclerosis.  Otherwise, unremarkable.

Musculoskeletal/Soft Tissues: Median sternotomy wires. There are no
aggressive appearing lytic or blastic lesions noted in the
visualized portions of the skeleton. Cavernous hemangioma in T12
vertebral body. L1 compression fracture with involving the superior
endplate with approximately 25% loss of anterior vertebral body
height.
IMPRESSION: 1. The appearance of the lungs is compatible with interstitial lung
disease, and the spectrum of findings is favored to reflect
nonspecific interstitial pneumonia (NSIP). However, repeat
high-resolution chest CT is suggested in 12 months to assess for
temporal changes in the appearance of the lung parenchyma, as early
usual interstitial pneumonia (UIP) is not entirely excluded (but is
not strongly favored at this time).
2. Atherosclerosis, including 2 vessel coronary artery disease.
Please note that although the presence of coronary artery calcium
documents the presence of coronary artery disease, the severity of
this disease and any potential stenosis cannot be assessed on this
non-gated CT examination. Assessment for potential risk factor
modification, dietary therapy or pharmacologic therapy may be
warranted, if clinically indicated.
3. Additional incidental findings, as above.

## 2023-04-01 DEATH — deceased
